# Patient Record
Sex: Female | Born: 1965 | Race: White | Hispanic: No | Marital: Single | Smoking: Never smoker
Health system: Southern US, Community
[De-identification: ages and names within clinical notes are randomized; demographics above are authoritative.]

---

## 2007-11-05 ENCOUNTER — Ambulatory Visit: Payer: Self-pay | Admitting: Family Medicine

## 2014-07-12 DIAGNOSIS — I10 Essential (primary) hypertension: Secondary | ICD-10-CM | POA: Insufficient documentation

## 2014-09-27 ENCOUNTER — Ambulatory Visit: Payer: Self-pay | Admitting: Family Medicine

## 2016-08-22 ENCOUNTER — Ambulatory Visit (INDEPENDENT_AMBULATORY_CARE_PROVIDER_SITE_OTHER): Payer: BC Managed Care – PPO | Admitting: Podiatry

## 2016-08-22 ENCOUNTER — Encounter: Payer: Self-pay | Admitting: Podiatry

## 2016-08-22 ENCOUNTER — Ambulatory Visit (INDEPENDENT_AMBULATORY_CARE_PROVIDER_SITE_OTHER): Payer: BC Managed Care – PPO

## 2016-08-22 VITALS — BP 127/81 | HR 85 | Resp 16

## 2016-08-22 DIAGNOSIS — M84375A Stress fracture, left foot, initial encounter for fracture: Secondary | ICD-10-CM

## 2016-08-22 DIAGNOSIS — R52 Pain, unspecified: Secondary | ICD-10-CM

## 2016-08-22 DIAGNOSIS — T148XXA Other injury of unspecified body region, initial encounter: Secondary | ICD-10-CM | POA: Diagnosis not present

## 2016-08-22 NOTE — Progress Notes (Signed)
   Subjective:    Patient ID: Leah Kelly, female    DOB: [birth year], 51 y.o.   MRN: [id number]  HPI she presents today with a chief complaint of pain to the fifth metatarsal area of the left foot. She states that approximately 1-1/2 months ago she dropped a school chair on the side of her foot. She initially thought it was getting better and it has worsened to the point where she can hardly wear shoes. She states that she has 1 pair of shoes that actually fit. She states that is very painful to walk on and it swells considerably she's tried nothing to treat it.    Review of Systems  All other systems reviewed and are negative.      Objective:   Physical Exam: Vital signs are stable she is alert and oriented 3. Pulses are palpable. Neurologic sensorium is intact. Deep tendon reflexes are intact and muscle strength is symmetrical and normal bilateral. Orthopedic evaluation demonstrates no pain on range of motion of the first metatarsal through the fifth metatarsophalangeal joints of the left foot. Hallux valgus deformity is noted bilaterally and mild tailor's bunion deformities are also noted bilaterally she has pain on palpation of the fifth metatarsal head primarily near the neck. She also has tenderness radiating from that area to the base of the fifth metatarsal. Radiographs of her foot 3 views left foot taken today in the office demonstrated normal osseous foot with hallux valgus deformity and what appears to be a fracture of the neck of the fifth metatarsal with some periostitis and sclerosing in the neck and head of the bone. This appears to be a fracture that is healing. She has tenderness on palpation of this fifth metatarsal plantarly and dorsally very much like a contusion or edema within the bone.        Assessment & Plan:  Contusion fifth metatarsal left possible fracture fifth metatarsal left nondisplaced non-comminuted.  Plan: Place her in a Cam Walker and I will follow-up with  her in 4 weeks.

## 2016-09-19 ENCOUNTER — Encounter: Payer: Self-pay | Admitting: Podiatry

## 2016-09-19 ENCOUNTER — Ambulatory Visit (INDEPENDENT_AMBULATORY_CARE_PROVIDER_SITE_OTHER): Payer: BC Managed Care – PPO

## 2016-09-19 ENCOUNTER — Ambulatory Visit (INDEPENDENT_AMBULATORY_CARE_PROVIDER_SITE_OTHER): Payer: BC Managed Care – PPO | Admitting: Podiatry

## 2016-09-19 DIAGNOSIS — M84375D Stress fracture, left foot, subsequent encounter for fracture with routine healing: Secondary | ICD-10-CM

## 2016-09-19 NOTE — Progress Notes (Signed)
She presents today for follow-up of her contusion possible stress fracture of the fifth metatarsal of the left foot. She states that he was doing really good until just the other night he started to have pain once again. She states that she has not been wearing her boot faithfully at all times and she also states that she's been using a wheelchair while at work.  Objective: Vital signs are stable she is alert and oriented 3. She has minimal reproduction of pain today much less than previously noted. Radiographs today do not demonstrate much of a change from previous radiographs I don't see any cortical thickening or periosteal reaction indicative of a true through and through fracture but the majority of her pain is at the base of the fifth metatarsal with a peroneal brevis inserts. She does have pain on inversion and eversion of the forefoot she also has tenderness on abduction against resistance particularly of the peroneal brevis at its insertion.  Assessment: Contusion fifth metatarsal left secondary to falling chair at work. Possible tendinitis possible endosteal stress reaction.  Plan: Continue the use of the Cam Walker and offloading for the next month.

## 2016-10-17 ENCOUNTER — Ambulatory Visit: Payer: BC Managed Care – PPO | Admitting: Podiatry

## 2016-10-22 ENCOUNTER — Ambulatory Visit: Payer: BC Managed Care – PPO | Admitting: Podiatry

## 2016-10-25 ENCOUNTER — Encounter: Payer: Self-pay | Admitting: Podiatry

## 2016-10-25 ENCOUNTER — Ambulatory Visit (INDEPENDENT_AMBULATORY_CARE_PROVIDER_SITE_OTHER): Payer: BC Managed Care – PPO

## 2016-10-25 ENCOUNTER — Ambulatory Visit (INDEPENDENT_AMBULATORY_CARE_PROVIDER_SITE_OTHER): Payer: BC Managed Care – PPO | Admitting: Podiatry

## 2016-10-25 DIAGNOSIS — M84375D Stress fracture, left foot, subsequent encounter for fracture with routine healing: Secondary | ICD-10-CM

## 2016-10-25 DIAGNOSIS — M778 Other enthesopathies, not elsewhere classified: Secondary | ICD-10-CM

## 2016-10-25 DIAGNOSIS — M779 Enthesopathy, unspecified: Secondary | ICD-10-CM

## 2016-10-25 DIAGNOSIS — M7752 Other enthesopathy of left foot: Secondary | ICD-10-CM | POA: Diagnosis not present

## 2016-10-25 NOTE — Progress Notes (Signed)
She presents today for follow-up of her fracture fifth metatarsal of the left foot. States it is still quite sore and she points to the fifth metatarsal distally. She states that she cannot wear closed shoes.  Objective: Vital signs are stable she is alert and oriented 3. Pulses are palpable. His very obvious by radiographic evaluation that the fracture is going on to do quite nicely across the metatarsal neck of the left foot fifth metatarsal. She also has pain on palpation to the fifth metatarsophalangeal joint and neuritis.  Assessment: Neuritis capsulitis. Metatarsophalangeal joint left foot well-healing fracture fifth met left.  Plan: I injected the area today with placement of local anesthetic to follow up with her for 6 weeks.

## 2016-11-05 ENCOUNTER — Ambulatory Visit: Payer: BC Managed Care – PPO | Admitting: Podiatry

## 2016-11-28 ENCOUNTER — Ambulatory Visit (INDEPENDENT_AMBULATORY_CARE_PROVIDER_SITE_OTHER): Payer: BC Managed Care – PPO

## 2016-11-28 ENCOUNTER — Encounter: Payer: Self-pay | Admitting: Podiatry

## 2016-11-28 ENCOUNTER — Ambulatory Visit (INDEPENDENT_AMBULATORY_CARE_PROVIDER_SITE_OTHER): Payer: BC Managed Care – PPO | Admitting: Podiatry

## 2016-11-28 DIAGNOSIS — M84375D Stress fracture, left foot, subsequent encounter for fracture with routine healing: Secondary | ICD-10-CM

## 2016-11-28 NOTE — Progress Notes (Signed)
She presents today for follow-up of her fifth metatarsal fracture left foot. She states it seems to be doing better.  Objective: Vital signs are stable alert and oriented 3. Pulses are palpable. She has minimal pain on palpation of the fifth metatarsal of the left foot. Radial grafts taken today demonstrate fracture of the fifth metatarsal neck distally which appears to be healing uneventfully.  Assessment: Healing fifth metatarsal fracture distally.  Plan: Follow up with me on an as-needed basis.

## 2017-04-10 ENCOUNTER — Other Ambulatory Visit: Payer: Self-pay | Admitting: Family Medicine

## 2017-04-10 DIAGNOSIS — N6489 Other specified disorders of breast: Secondary | ICD-10-CM

## 2017-04-11 ENCOUNTER — Other Ambulatory Visit: Payer: Self-pay | Admitting: Internal Medicine

## 2017-04-11 DIAGNOSIS — R748 Abnormal levels of other serum enzymes: Secondary | ICD-10-CM

## 2017-04-16 ENCOUNTER — Ambulatory Visit
Admission: RE | Admit: 2017-04-16 | Discharge: 2017-04-16 | Disposition: A | Discharge status: Home / Self Care | Payer: BC Managed Care – PPO | Source: Ambulatory Visit | Attending: Internal Medicine | Admitting: Internal Medicine

## 2017-04-16 DIAGNOSIS — K760 Fatty (change of) liver, not elsewhere classified: Secondary | ICD-10-CM | POA: Insufficient documentation

## 2017-04-16 DIAGNOSIS — R748 Abnormal levels of other serum enzymes: Secondary | ICD-10-CM | POA: Diagnosis present

## 2017-05-06 ENCOUNTER — Ambulatory Visit
Admission: RE | Admit: 2017-05-06 | Discharge: 2017-05-06 | Disposition: A | Discharge status: Home / Self Care | Payer: BC Managed Care – PPO | Source: Ambulatory Visit | Attending: Family Medicine | Admitting: Family Medicine

## 2017-05-06 DIAGNOSIS — N6489 Other specified disorders of breast: Secondary | ICD-10-CM | POA: Insufficient documentation

## 2017-05-06 DIAGNOSIS — R928 Other abnormal and inconclusive findings on diagnostic imaging of breast: Secondary | ICD-10-CM | POA: Insufficient documentation

## 2018-10-23 ENCOUNTER — Other Ambulatory Visit: Payer: Self-pay | Admitting: Family Medicine

## 2018-10-23 DIAGNOSIS — Z1231 Encounter for screening mammogram for malignant neoplasm of breast: Secondary | ICD-10-CM

## 2018-12-17 ENCOUNTER — Other Ambulatory Visit: Payer: Self-pay

## 2018-12-17 ENCOUNTER — Ambulatory Visit
Admission: RE | Admit: 2018-12-17 | Discharge: 2018-12-17 | Disposition: A | Discharge status: Home / Self Care | Payer: BC Managed Care – PPO | Source: Ambulatory Visit | Attending: Family Medicine | Admitting: Family Medicine

## 2018-12-17 DIAGNOSIS — Z1231 Encounter for screening mammogram for malignant neoplasm of breast: Secondary | ICD-10-CM | POA: Insufficient documentation

## 2018-12-19 ENCOUNTER — Other Ambulatory Visit: Payer: Self-pay | Admitting: Family Medicine

## 2018-12-19 DIAGNOSIS — N6489 Other specified disorders of breast: Secondary | ICD-10-CM

## 2018-12-19 DIAGNOSIS — R928 Other abnormal and inconclusive findings on diagnostic imaging of breast: Secondary | ICD-10-CM

## 2019-08-21 IMAGING — MG DIGITAL SCREENING BILATERAL MAMMOGRAM WITH TOMO AND CAD
6 of 10 series · 6 of 30 positions shown · non-contrast
Comparison: Previous exam(s).

CLINICAL DATA: Screening.

EXAM:
DIGITAL SCREENING BILATERAL MAMMOGRAM WITH TOMO AND CAD

[L MLO synth-2D (1 of 2)]
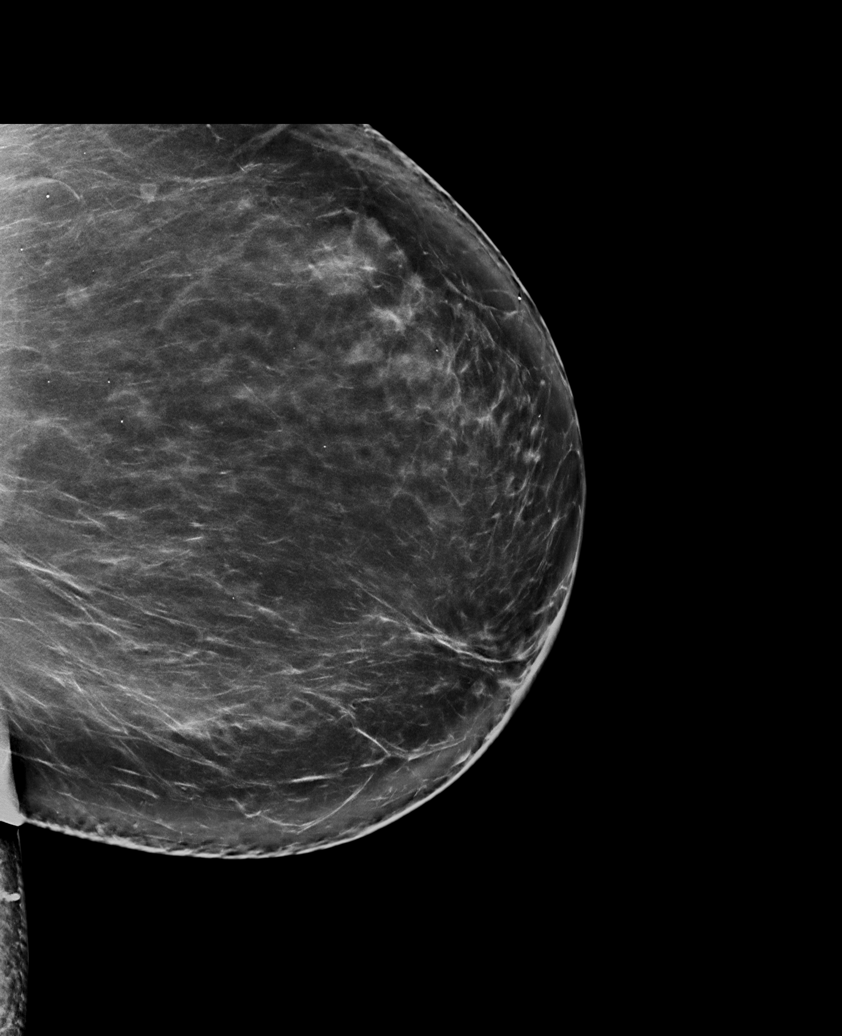

[L MLO synth-2D (2 of 2)]
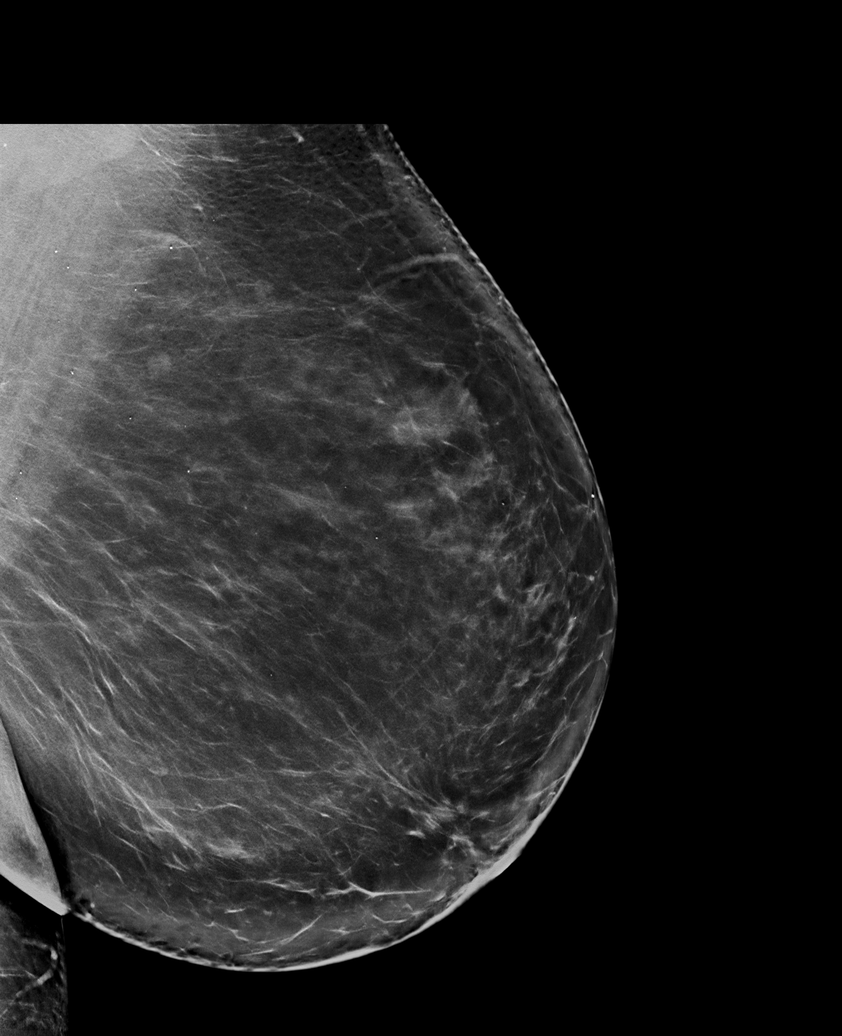

[R CC synth-2D]
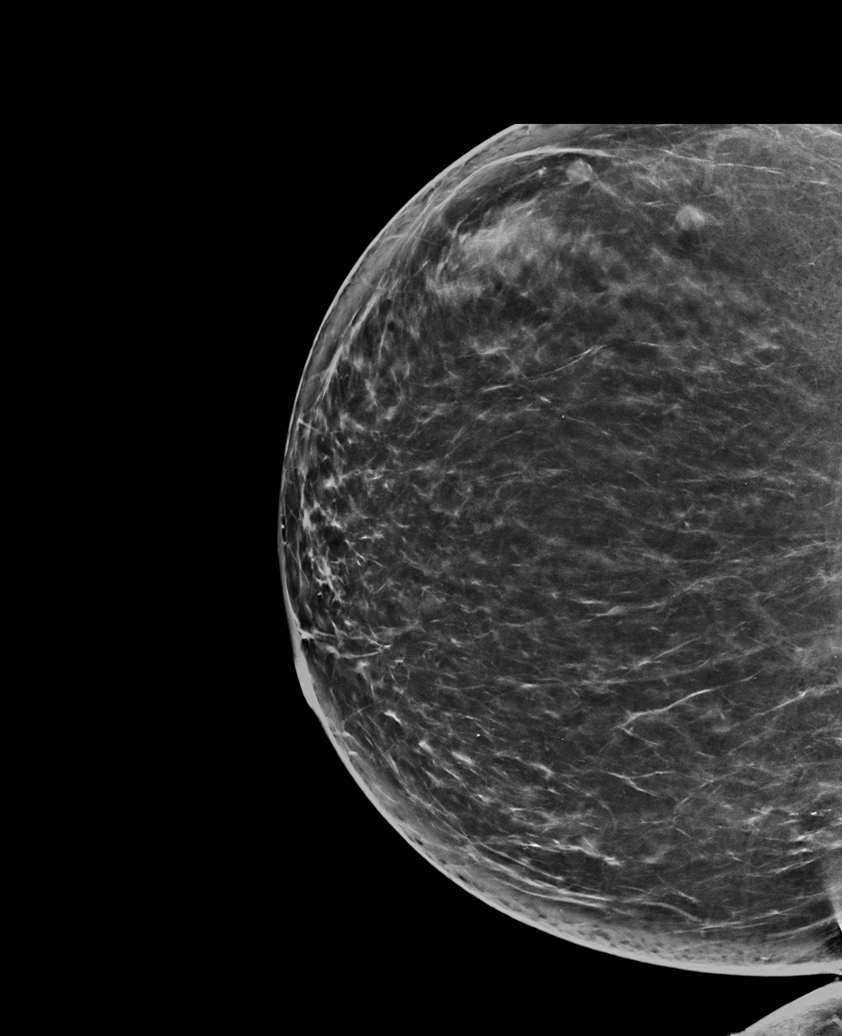

[R MLO synth-2D]
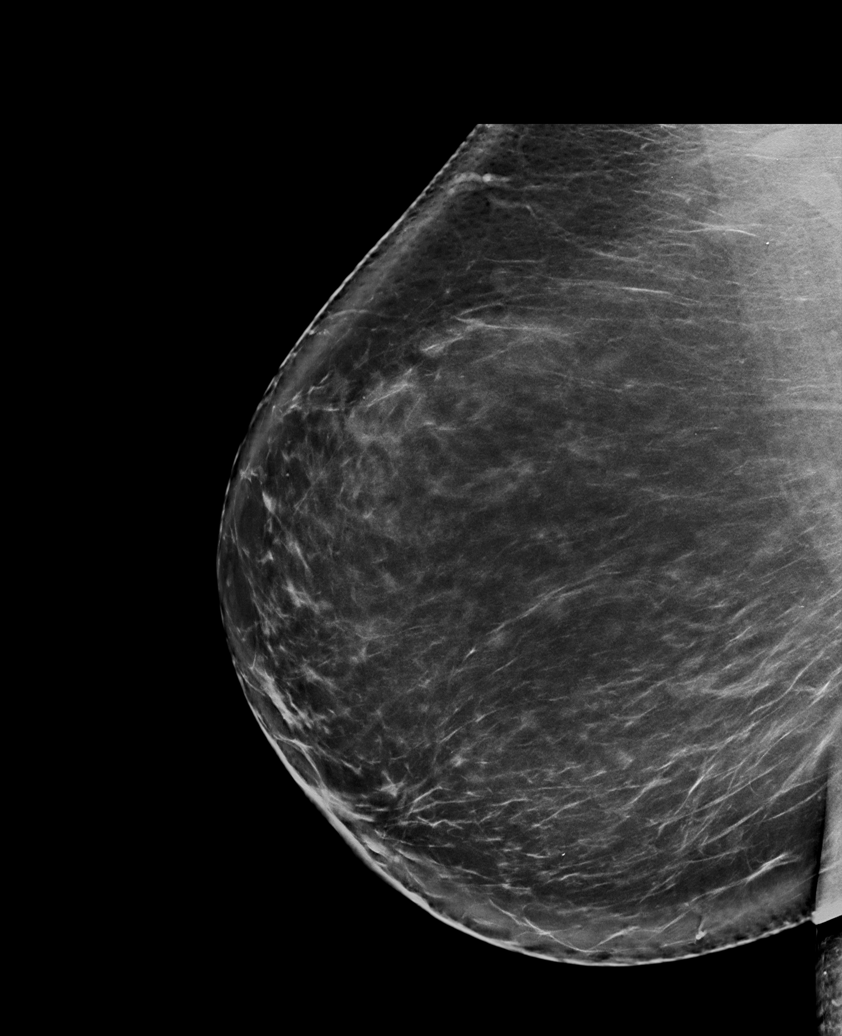

[L CC synth-2D]
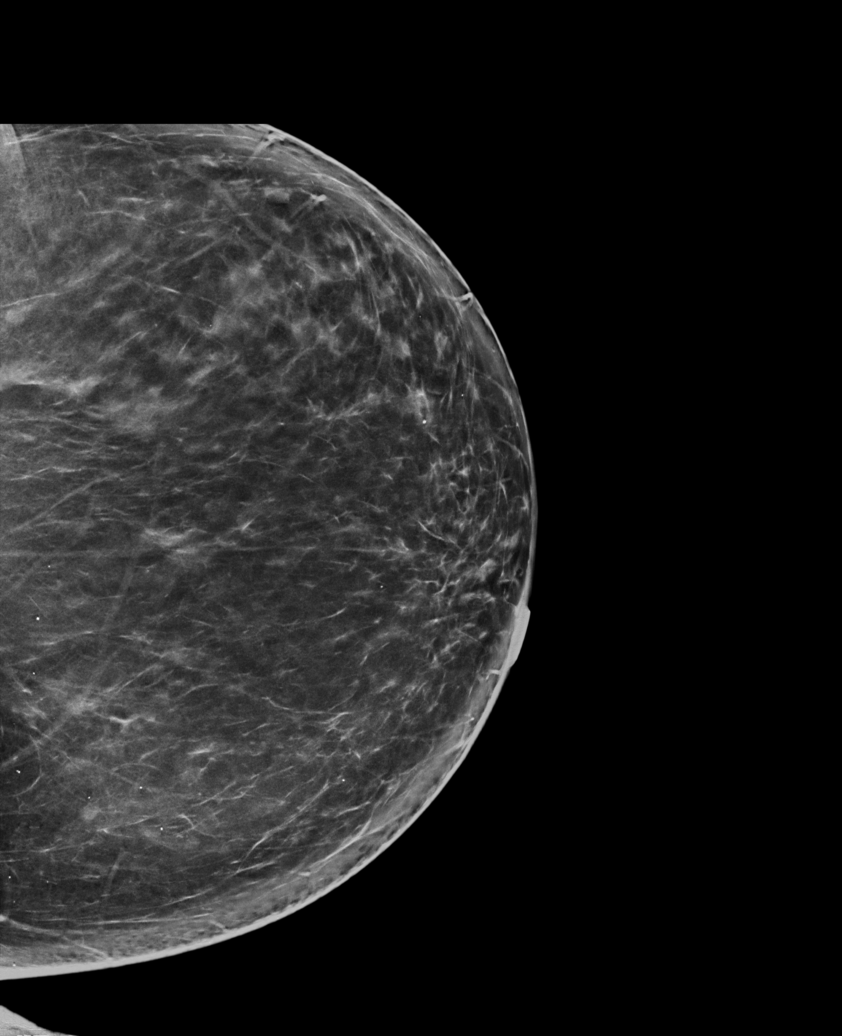

[R MLO tomo · tomo slice 57/112.0]
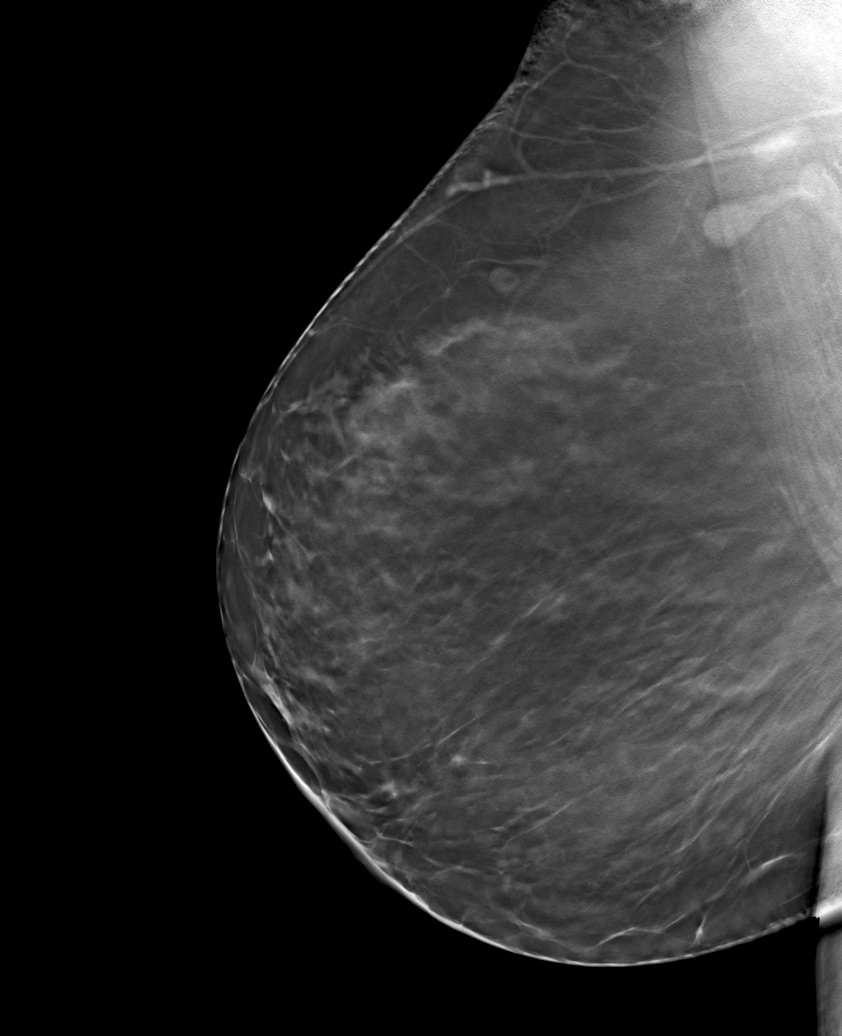

[6 of 30 positions shown; findings below may reference images not displayed]

ACR Breast Density Category c: The breast tissue is heterogeneously
dense, which may obscure small masses.
FINDINGS: In the left breast, a possible asymmetry warrants further
evaluation. In the right breast, no findings suspicious for
malignancy. Images were processed with CAD.
IMPRESSION: Further evaluation is suggested for possible asymmetry in the left
breast.

RECOMMENDATION:
Diagnostic mammogram and possibly ultrasound of the left breast.
(Code:[id number])

The patient will be contacted regarding the findings, and additional
imaging will be scheduled.

BI-RADS CATEGORY  0: Incomplete. Need additional imaging evaluation
and/or prior mammograms for comparison.

## 2021-08-30 ENCOUNTER — Ambulatory Visit: Payer: BC Managed Care – PPO | Admitting: Podiatry

## 2021-08-30 ENCOUNTER — Encounter: Payer: Self-pay | Admitting: Podiatry

## 2021-08-30 ENCOUNTER — Other Ambulatory Visit: Payer: Self-pay

## 2021-08-30 ENCOUNTER — Ambulatory Visit (INDEPENDENT_AMBULATORY_CARE_PROVIDER_SITE_OTHER): Payer: BC Managed Care – PPO

## 2021-08-30 DIAGNOSIS — M722 Plantar fascial fibromatosis: Secondary | ICD-10-CM | POA: Diagnosis not present

## 2021-08-30 MED ORDER — METHYLPREDNISOLONE 4 MG PO TBPK: ORAL_TABLET | ORAL | 0 refills | Status: DC

## 2021-08-30 MED ORDER — MELOXICAM 15 MG PO TABS: 15.0000 mg | ORAL_TABLET | Freq: Every day | ORAL | 3 refills | Status: AC

## 2021-08-30 MED ORDER — TRIAMCINOLONE ACETONIDE 40 MG/ML IJ SUSP
20.0000 mg | Freq: Once | INTRAMUSCULAR | Status: AC
  Administered 2021-08-30: 20 mg

## 2021-08-30 NOTE — Progress Notes (Signed)
?  Subjective:  ?Patient ID: Leah Kelly, female    DOB: 1965-09-17,  MRN: [id number] ?HPI ?Chief Complaint  ?Patient presents with  ? Foot Pain  ?  Plantar heel left - aching since October, AM pain, no treatment  ? New Patient (Initial Visit)  ?  Est pt 2018  ? ? ?56 y.o. female presents with the above complaint.  ? ?ROS: Denies fever chills nausea vomiting muscle aches pains calf pain back pain chest pain shortness of breath. ? ?No past medical history on file. ? ? ?Current Outpatient Medications:  ?  meloxicam (MOBIC) 15 MG tablet, Take 1 tablet (15 mg total) by mouth daily., Disp: 30 tablet, Rfl: 3 ?  methylPREDNISolone (MEDROL DOSEPAK) 4 MG TBPK tablet, 6 day dose pack - take as directed, Disp: 21 tablet, Rfl: 0 ?  lisinopril-hydrochlorothiazide (PRINZIDE,ZESTORETIC) 20-12.5 MG tablet, , Disp: , Rfl:  ? ?Allergies  ?Allergen Reactions  ? Hydrocodone Bit-Homatrop Mbr   ?  Other reaction(s): Other (See Comments) ?Tachycardia, vomiting  ? ?Review of Systems ?Objective:  ?There were no vitals filed for this visit. ? ?General: Well developed, nourished, in no acute distress, alert and oriented x3  ? ?Dermatological: Skin is warm, dry and supple bilateral. Nails x 10 are well maintained; remaining integument appears unremarkable at this time. There are no open sores, no preulcerative lesions, no rash or signs of infection present. ? ?Vascular: Dorsalis Pedis artery and Posterior Tibial artery pedal pulses are 2/4 bilateral with immedate capillary fill time. Pedal hair growth present. No varicosities and no lower extremity edema present bilateral.  ? ?Neruologic: Grossly intact via light touch bilateral. Vibratory intact via tuning fork bilateral. Protective threshold with Semmes Wienstein monofilament intact to all pedal sites bilateral. Patellar and Achilles deep tendon reflexes 2+ bilateral. No Babinski or clonus noted bilateral.  ? ?Musculoskeletal: No gross boney pedal deformities bilateral. No pain, crepitus, or  limitation noted with foot and ankle range of motion bilateral. Muscular strength 5/5 in all groups tested bilateral.  Severe pain to palpation MucoClear tubercle of the left heel.  There is no pain on medial lateral compression of the calcaneus.  Mild tenderness on palpation of the Achilles at its insertion site. ? ?Gait: Unassisted, Nonantalgic.  ? ? ?Radiographs: ? ?Radiographs taken today demonstrate an osseously mature individual small plantar distally oriented calcaneal heel spurs and 1 posterior proximal calcaneal heel spur as well.  There does appear to be soft tissue swelling at the plantar fashion calcaneal insertion site.  This is indicative of Planter fasciitis. ? ?Assessment & Plan:  ? ?Assessment: Planter fasciitis early Achilles tendinitis left. ? ?Plan: Started her on a Medrol Dosepak to be followed by meloxicam.  Injected the heel today with Kenalog and local anesthetic placed in a plantar fascial brace and a night splint discussed appropriate shoe gear stretching exercise ice therapy shoe modifications follow-up with her in a month ? ? ? ? ?Kalifa Cadden T. Milfay, DPM ?

## 2021-08-30 NOTE — Patient Instructions (Signed)
Plantar Fasciitis (Heel Spur Syndrome) °with Rehab °The plantar fascia is a fibrous, ligament-like, soft-tissue structure that spans the bottom of the foot. Plantar fasciitis is a condition that causes pain in the foot due to inflammation of the tissue. °SYMPTOMS  °Pain and tenderness on the underneath side of the foot. °Pain that worsens with standing or walking. °CAUSES  °Plantar fasciitis is caused by irritation and injury to the plantar fascia on the underneath side of the foot. Common mechanisms of injury include: °Direct trauma to bottom of the foot. °Damage to a small nerve that runs under the foot where the main fascia attaches to the heel bone. °Stress placed on the plantar fascia due to bone spurs. °RISK INCREASES WITH:  °Activities that place stress on the plantar fascia (running, jumping, pivoting, or cutting). °Poor strength and flexibility. °Improperly fitted shoes. °Tight calf muscles. °Flat feet. °Failure to warm-up properly before activity. °Obesity. °PREVENTION °Warm up and stretch properly before activity. °Allow for adequate recovery between workouts. °Maintain physical fitness: °Strength, flexibility, and endurance. °Cardiovascular fitness. °Maintain a health body weight. °Avoid stress on the plantar fascia. °Wear properly fitted shoes, including arch supports for individuals who have flat feet. ° °PROGNOSIS  °If treated properly, then the symptoms of plantar fasciitis usually resolve without surgery. However, occasionally surgery is necessary. ° °RELATED COMPLICATIONS  °Recurrent symptoms that may result in a chronic condition. °Problems of the lower back that are caused by compensating for the injury, such as limping. °Pain or weakness of the foot during push-off following surgery. °Chronic inflammation, scarring, and partial or complete fascia tear, occurring more often from repeated injections. ° °TREATMENT  °Treatment initially involves the use of ice and medication to help reduce pain and  inflammation. The use of strengthening and stretching exercises may help reduce pain with activity, especially stretches of the Achilles tendon. These exercises may be performed at home or with a therapist. Your caregiver may recommend that you use heel cups of arch supports to help reduce stress on the plantar fascia. Occasionally, corticosteroid injections are given to reduce inflammation. If symptoms persist for greater than 6 months despite non-surgical (conservative), then surgery may be recommended.  ° °MEDICATION  °If pain medication is necessary, then nonsteroidal anti-inflammatory medications, such as aspirin and ibuprofen, or other minor pain relievers, such as acetaminophen, are often recommended. °Do not take pain medication within 7 days before surgery. °Prescription pain relievers may be given if deemed necessary by your caregiver. Use only as directed and only as much as you need. °Corticosteroid injections may be given by your caregiver. These injections should be reserved for the most serious cases, because they may only be given a certain number of times. ° °HEAT AND COLD °Cold treatment (icing) relieves pain and reduces inflammation. Cold treatment should be applied for 10 to 15 minutes every 2 to 3 hours for inflammation and pain and immediately after any activity that aggravates your symptoms. Use ice packs or massage the area with a piece of ice (ice massage). °Heat treatment may be used prior to performing the stretching and strengthening activities prescribed by your caregiver, physical therapist, or athletic trainer. Use a heat pack or soak the injury in warm water. ° °SEEK IMMEDIATE MEDICAL CARE IF: °Treatment seems to offer no benefit, or the condition worsens. °Any medications produce adverse side effects. ° °EXERCISES- °RANGE OF MOTION (ROM) AND STRETCHING EXERCISES - Plantar Fasciitis (Heel Spur Syndrome) °These exercises may help you when beginning to rehabilitate your injury. Your    symptoms may resolve with or without further involvement from your physician, physical therapist or athletic trainer. While completing these exercises, remember:  °Restoring tissue flexibility helps normal motion to return to the joints. This allows healthier, less painful movement and activity. °An effective stretch should be held for at least 30 seconds. °A stretch should never be painful. You should only feel a gentle lengthening or release in the stretched tissue. ° °RANGE OF MOTION - Toe Extension, Flexion °Sit with your right / left leg crossed over your opposite knee. °Grasp your toes and gently pull them back toward the top of your foot. You should feel a stretch on the bottom of your toes and/or foot. °Hold this stretch for 10 seconds. °Now, gently pull your toes toward the bottom of your foot. You should feel a stretch on the top of your toes and or foot. °Hold this stretch for 10 seconds. °Repeat  times. Complete this stretch 3 times per day.  ° °RANGE OF MOTION - Ankle Dorsiflexion, Active Assisted °Remove shoes and sit on a chair that is preferably not on a carpeted surface. °Place right / left foot under knee. Extend your opposite leg for support. °Keeping your heel down, slide your right / left foot back toward the chair until you feel a stretch at your ankle or calf. If you do not feel a stretch, slide your bottom forward to the edge of the chair, while still keeping your heel down. °Hold this stretch for 10 seconds. °Repeat 3 times. Complete this stretch 2 times per day.  ° °STRETCH  Gastroc, Standing °Place hands on wall. °Extend right / left leg, keeping the front knee somewhat bent. °Slightly point your toes inward on your back foot. °Keeping your right / left heel on the floor and your knee straight, shift your weight toward the wall, not allowing your back to arch. °You should feel a gentle stretch in the right / left calf. Hold this position for 10 seconds. °Repeat 3 times. Complete this  stretch 2 times per day. ° °STRETCH  Soleus, Standing °Place hands on wall. °Extend right / left leg, keeping the other knee somewhat bent. °Slightly point your toes inward on your back foot. °Keep your right / left heel on the floor, bend your back knee, and slightly shift your weight over the back leg so that you feel a gentle stretch deep in your back calf. °Hold this position for 10 seconds. °Repeat 3 times. Complete this stretch 2 times per day. ° °STRETCH  Gastrocsoleus, Standing  °Note: This exercise can place a lot of stress on your foot and ankle. Please complete this exercise only if specifically instructed by your caregiver.  °Place the ball of your right / left foot on a step, keeping your other foot firmly on the same step. °Hold on to the wall or a rail for balance. °Slowly lift your other foot, allowing your body weight to press your heel down over the edge of the step. °You should feel a stretch in your right / left calf. °Hold this position for 10 seconds. °Repeat this exercise with a slight bend in your right / left knee. °Repeat 3 times. Complete this stretch 2 times per day.  ° °STRENGTHENING EXERCISES - Plantar Fasciitis (Heel Spur Syndrome)  °These exercises may help you when beginning to rehabilitate your injury. They may resolve your symptoms with or without further involvement from your physician, physical therapist or athletic trainer. While completing these exercises, remember:  °Muscles can   gain both the endurance and the strength needed for everyday activities through controlled exercises. °Complete these exercises as instructed by your physician, physical therapist or athletic trainer. Progress the resistance and repetitions only as guided. ° °STRENGTH - Towel Curls °Sit in a chair positioned on a non-carpeted surface. °Place your foot on a towel, keeping your heel on the floor. °Pull the towel toward your heel by only curling your toes. Keep your heel on the floor. °Repeat 3 times.  Complete this exercise 2 times per day. ° °STRENGTH - Ankle Inversion °Secure one end of a rubber exercise band/tubing to a fixed object (table, pole). Loop the other end around your foot just before your toes. °Place your fists between your knees. This will focus your strengthening at your ankle. °Slowly, pull your big toe up and in, making sure the band/tubing is positioned to resist the entire motion. °Hold this position for 10 seconds. °Have your muscles resist the band/tubing as it slowly pulls your foot back to the starting position. °Repeat 3 times. Complete this exercises 2 times per day.  °Document Released: 06/18/2005 Document Revised: 09/10/2011 Document Reviewed: 09/30/2008 °[employer]® Patient Information ©2014 [employer]. ° °

## 2021-09-12 ENCOUNTER — Telehealth: Payer: Self-pay | Admitting: Podiatry

## 2021-09-12 NOTE — Telephone Encounter (Signed)
"  I received a brace from Dr. Al Corpus for Plantar Fasciitis.  It is too little.  It's hard for me to get it on myself."  What size is the brace?  "It's a small/medium."  Bring the brace back and I'll replace it with a large / x-large.  I'll leave it at the front desk. ? ? ?

## 2021-10-04 ENCOUNTER — Encounter: Payer: Self-pay | Admitting: Podiatry

## 2021-10-04 ENCOUNTER — Ambulatory Visit: Payer: BC Managed Care – PPO | Admitting: Podiatry

## 2021-10-04 DIAGNOSIS — M722 Plantar fascial fibromatosis: Secondary | ICD-10-CM

## 2021-10-04 NOTE — Progress Notes (Signed)
She presents today for follow-up of her Planter fasciitis left.  States that she continues to wear her braces daytime and nighttime but she stopped the meloxicam.  She states that she is 100% improved and does not hurt at all at this point. ? ?Objective: Vital signs are stable alert and oriented x3 there is no erythema edema salines drainage odor no reproducible pain on palpation of the left heel. ? ?Assessment: Resolving Planter fasciitis at 100%. ? ?Plan: Encouraged her to continue all current therapies including meloxicam daily for the next 30 days.  She understands and is amenable to it we will discontinue at that time if no regression. ?

## 2021-12-21 DIAGNOSIS — I10 Essential (primary) hypertension: Secondary | ICD-10-CM | POA: Diagnosis not present

## 2021-12-28 ENCOUNTER — Other Ambulatory Visit: Payer: Self-pay | Admitting: Family Medicine

## 2022-01-01 ENCOUNTER — Other Ambulatory Visit: Payer: Self-pay | Admitting: Family Medicine

## 2022-01-01 DIAGNOSIS — Z1231 Encounter for screening mammogram for malignant neoplasm of breast: Secondary | ICD-10-CM

## 2022-01-01 DIAGNOSIS — N6489 Other specified disorders of breast: Secondary | ICD-10-CM

## 2022-01-16 ENCOUNTER — Ambulatory Visit
Admission: RE | Admit: 2022-01-16 | Discharge: 2022-01-16 | Disposition: A | Discharge status: Home / Self Care | Payer: 59 | Source: Ambulatory Visit | Attending: Family Medicine | Admitting: Family Medicine

## 2022-01-16 DIAGNOSIS — Z1231 Encounter for screening mammogram for malignant neoplasm of breast: Secondary | ICD-10-CM

## 2022-01-16 DIAGNOSIS — N6489 Other specified disorders of breast: Secondary | ICD-10-CM | POA: Diagnosis not present

## 2022-01-16 DIAGNOSIS — R922 Inconclusive mammogram: Secondary | ICD-10-CM | POA: Diagnosis not present

## 2022-11-02 DIAGNOSIS — S76312A Strain of muscle, fascia and tendon of the posterior muscle group at thigh level, left thigh, initial encounter: Secondary | ICD-10-CM | POA: Diagnosis not present

## 2023-01-21 DIAGNOSIS — Z111 Encounter for screening for respiratory tuberculosis: Secondary | ICD-10-CM | POA: Diagnosis not present

## 2023-02-14 DIAGNOSIS — I10 Essential (primary) hypertension: Secondary | ICD-10-CM | POA: Diagnosis not present

## 2023-02-14 DIAGNOSIS — Z6841 Body Mass Index (BMI) 40.0 and over, adult: Secondary | ICD-10-CM | POA: Diagnosis not present

## 2023-02-14 DIAGNOSIS — H40053 Ocular hypertension, bilateral: Secondary | ICD-10-CM | POA: Diagnosis not present

## 2023-02-14 DIAGNOSIS — Z1211 Encounter for screening for malignant neoplasm of colon: Secondary | ICD-10-CM | POA: Diagnosis not present

## 2023-02-14 DIAGNOSIS — Z0000 Encounter for general adult medical examination without abnormal findings: Secondary | ICD-10-CM | POA: Diagnosis not present

## 2023-03-18 DIAGNOSIS — Z1211 Encounter for screening for malignant neoplasm of colon: Secondary | ICD-10-CM | POA: Diagnosis not present

## 2023-03-22 LAB — COLOGUARD: COLOGUARD: NEGATIVE

## 2023-03-22 LAB — EXTERNAL GENERIC LAB PROCEDURE: COLOGUARD: NEGATIVE

## 2023-08-20 ENCOUNTER — Encounter: Payer: Self-pay | Admitting: Obstetrics and Gynecology

## 2024-02-14 ENCOUNTER — Other Ambulatory Visit: Payer: Self-pay | Admitting: Family Medicine

## 2024-02-14 DIAGNOSIS — Z1231 Encounter for screening mammogram for malignant neoplasm of breast: Secondary | ICD-10-CM

## 2024-03-05 ENCOUNTER — Encounter

## 2024-03-10 ENCOUNTER — Ambulatory Visit
Admission: RE | Admit: 2024-03-10 | Discharge: 2024-03-10 | Disposition: A | Discharge status: Home / Self Care | Source: Ambulatory Visit | Attending: Family Medicine | Admitting: Family Medicine

## 2024-03-10 DIAGNOSIS — Z1231 Encounter for screening mammogram for malignant neoplasm of breast: Secondary | ICD-10-CM | POA: Insufficient documentation
